# Patient Record
Sex: Male | Born: 1975 | Race: Black or African American | Hispanic: No | Marital: Married
Health system: Northeastern US, Academic
[De-identification: ages and names within clinical notes are randomized; demographics above are authoritative.]

---

## 2009-01-05 IMAGING — CR Chest
2 series · 2 of 2 positions shown · non-contrast
Comparison: none

[PA]
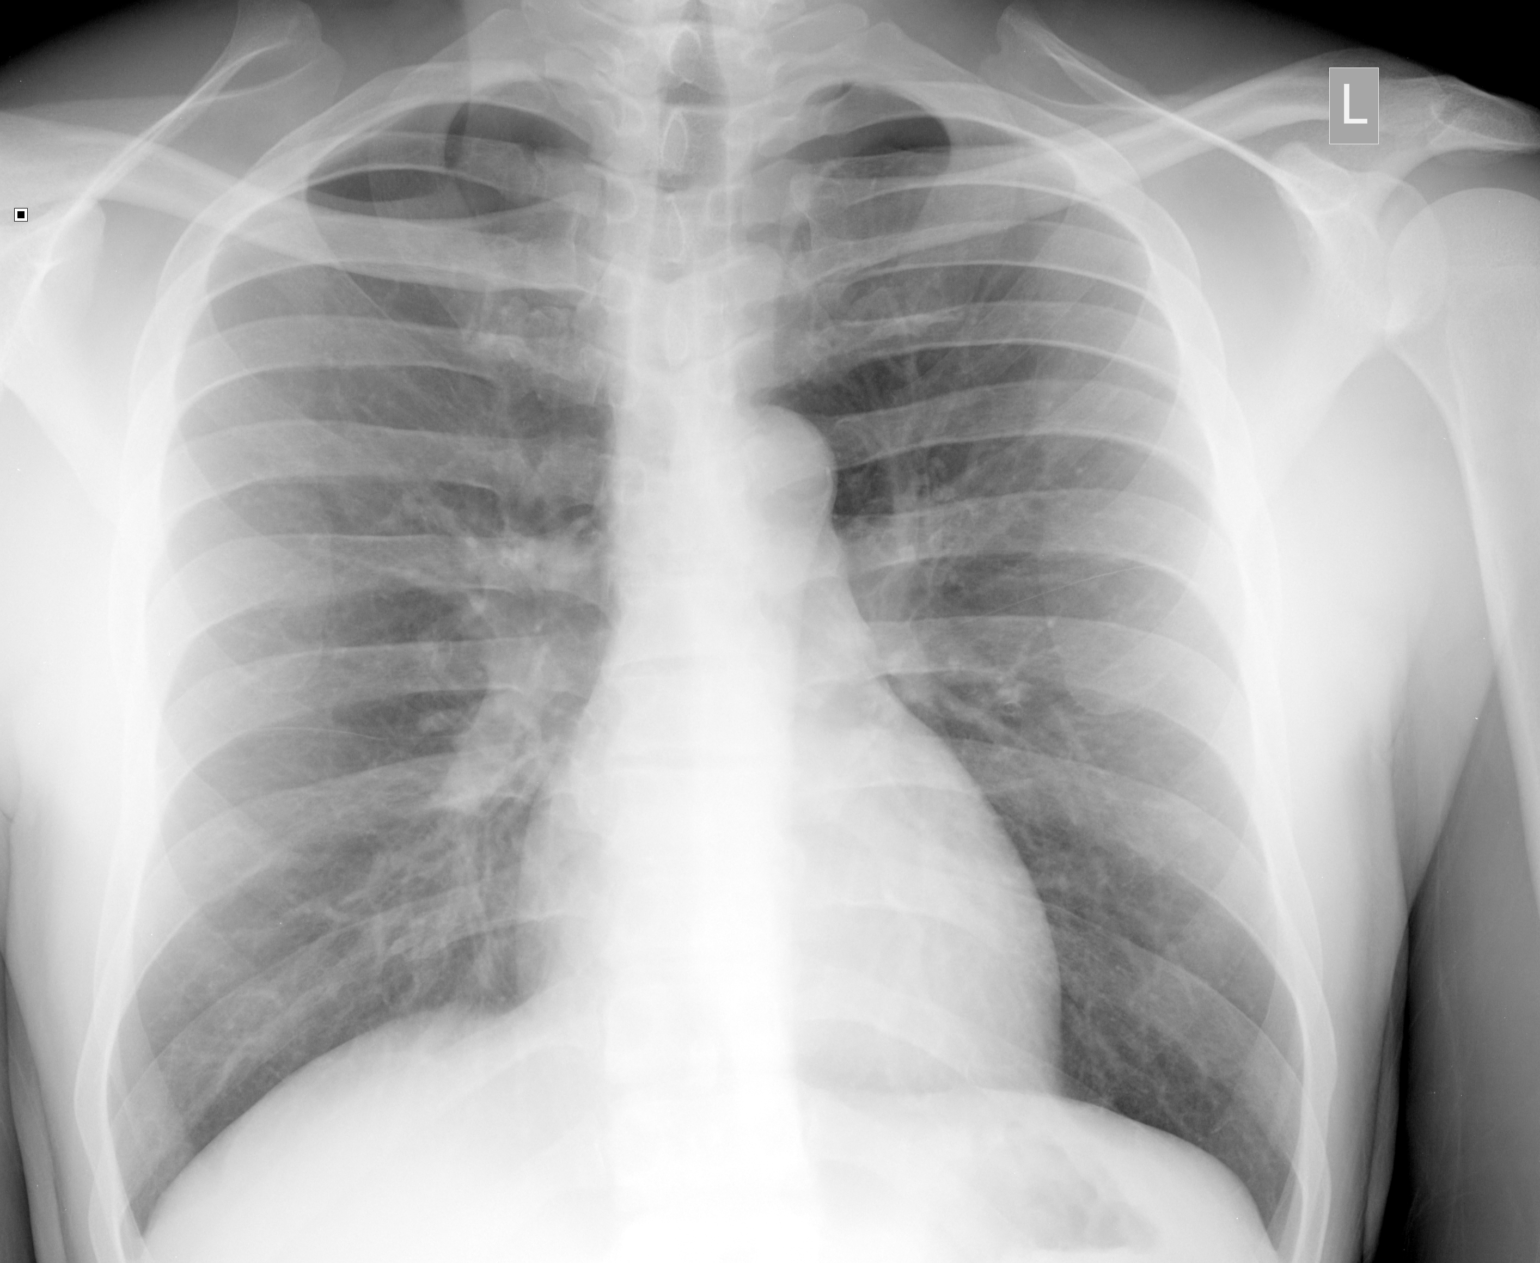

[left lateral]
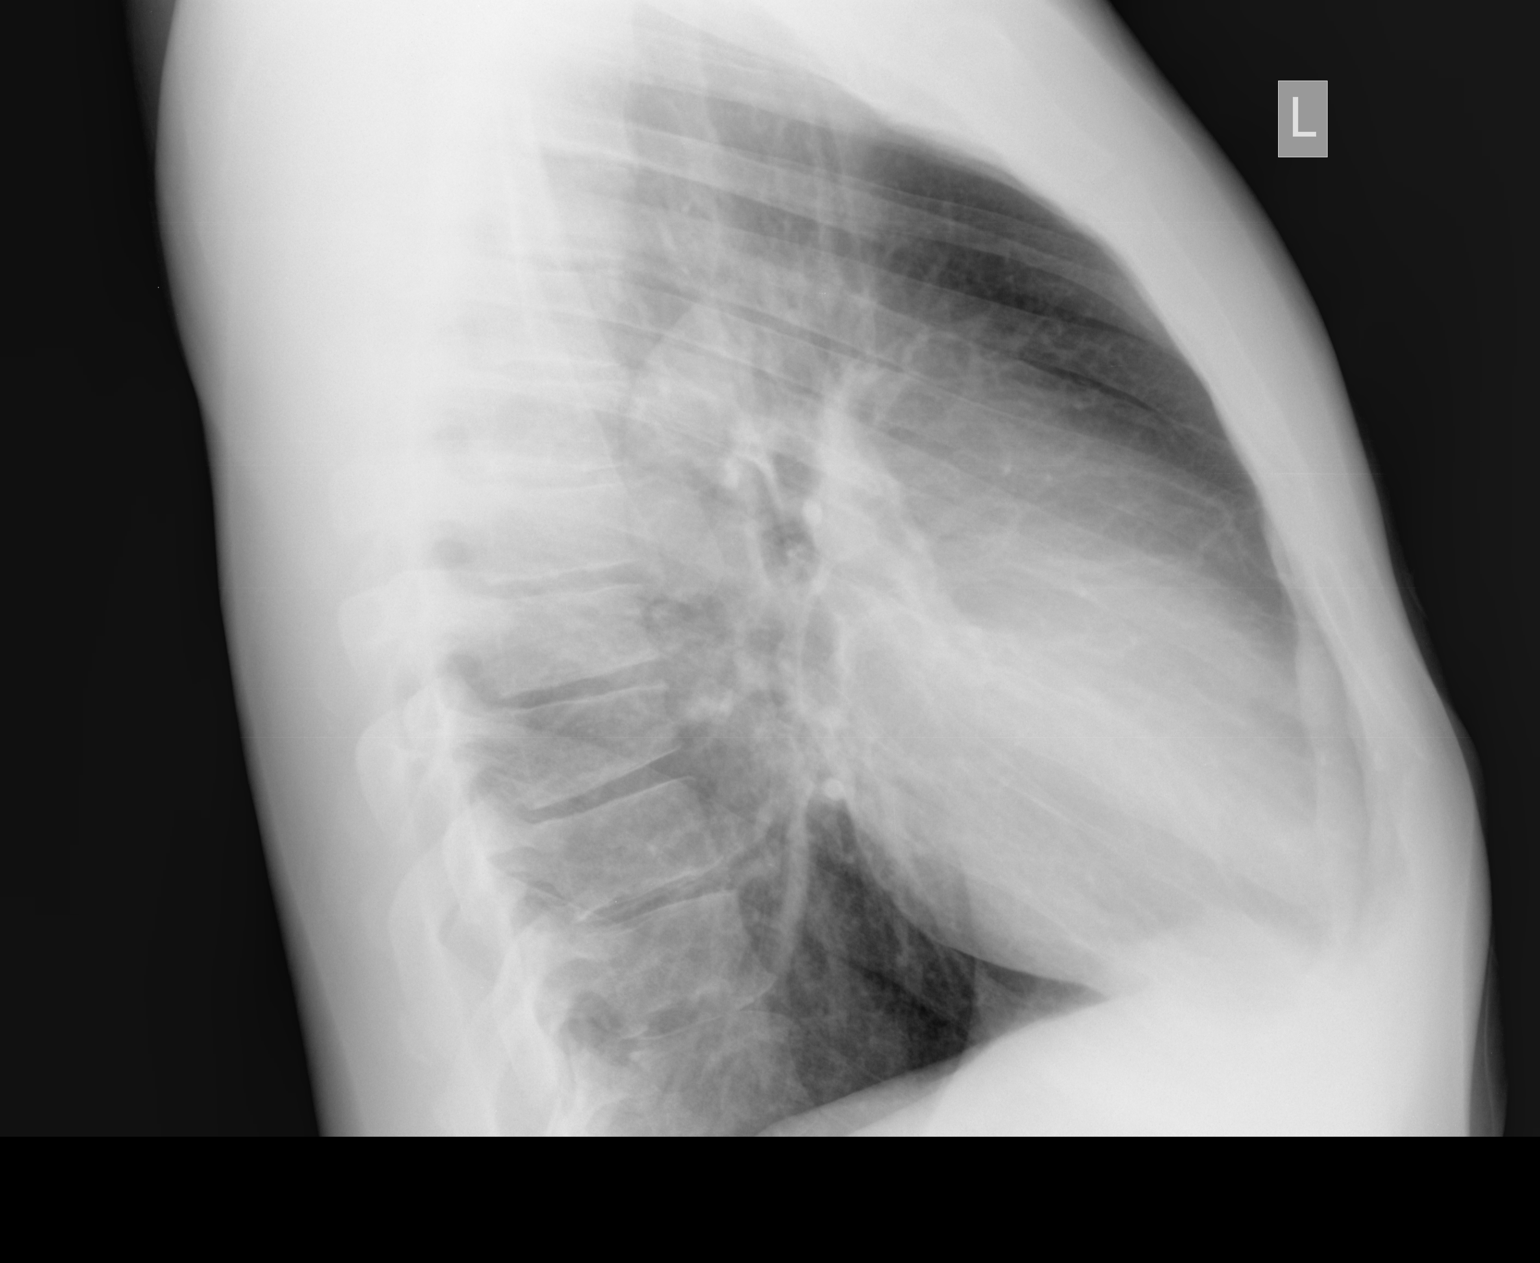

[2 of 2 positions shown; findings below may reference images not displayed]

CHEST-

PA and lateral views demonstrate the heart, hilar structures,

mediastinum and costophrenic angles to be unremarkable.  The lungs are

free of infiltrate and there is no evidence of pulmonary vascular

congestion.

IMPRESSION-  Negative chest.

## 2020-01-09 ENCOUNTER — Inpatient Hospital Stay: Admit: 2020-01-09 | Discharge: 2020-01-09 | Payer: MEDICAID | Primary: Family

## 2020-01-09 DIAGNOSIS — M545 Low back pain: Secondary | ICD-10-CM

## 2020-01-14 ENCOUNTER — Inpatient Hospital Stay: Admit: 2020-01-14 | Discharge: 2020-01-14 | Payer: MEDICAID | Primary: Family

## 2020-01-14 DIAGNOSIS — M5410 Radiculopathy, site unspecified: Secondary | ICD-10-CM

## 2020-01-14 DIAGNOSIS — M545 Chronic bilateral low back pain, unspecified whether sciatica present: Secondary | ICD-10-CM
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
## 2020-01-16 ENCOUNTER — Ambulatory Visit
Admit: 2020-01-16 | Payer: PRIVATE HEALTH INSURANCE | Attending: Rehabilitative and Restorative Service Providers" | Primary: Family

## 2020-01-17 ENCOUNTER — Inpatient Hospital Stay: Admit: 2020-01-17 | Discharge: 2020-01-17 | Payer: MEDICAID | Primary: Family

## 2020-01-17 DIAGNOSIS — M5410 Radiculopathy, site unspecified: Secondary | ICD-10-CM
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
## 2020-01-20 ENCOUNTER — Ambulatory Visit
Admit: 2020-01-20 | Payer: PRIVATE HEALTH INSURANCE | Attending: Rehabilitative and Restorative Service Providers" | Primary: Family

## 2020-01-21 ENCOUNTER — Inpatient Hospital Stay: Admit: 2020-01-21 | Discharge: 2020-01-21 | Payer: MEDICAID | Primary: Family

## 2020-01-21 DIAGNOSIS — M5410 Radiculopathy, site unspecified: Secondary | ICD-10-CM

## 2020-01-21 NOTE — Other
REHABILITATION SERVICES AT [facility] Services At [facility] [place] [address]UEAVW Number: [phone] Number: [id number] Therapy No Show Note3/16/2021Patient Name:  Dorance CultonMedical Record Number:  [id number] Date of Birth:  Apr 30, 1977Therapist:  Bonnita Levan, PTLamont Banner was a no show for an appointment today; states something came up  but did not have the contact information to call us. He states that he will be at his PT appt this Friday.Misty Stanley  I.  Albertha Beattie PT,DPT, Cert.MDTYNHH Outpatient Rehab SupervisorMil[place], [place], [place], [place]  [phone]-Th-F [phone]

## 2020-01-22 ENCOUNTER — Ambulatory Visit: Admit: 2020-01-22 | Payer: MEDICAID | Attending: Colon & Rectal Surgery | Primary: Family

## 2020-01-22 ENCOUNTER — Encounter: Admit: 2020-01-22 | Payer: PRIVATE HEALTH INSURANCE | Attending: Colon & Rectal Surgery | Primary: Family

## 2020-01-22 DIAGNOSIS — J45909 Unspecified asthma, uncomplicated: Secondary | ICD-10-CM

## 2020-01-22 DIAGNOSIS — I10 Essential (primary) hypertension: Secondary | ICD-10-CM

## 2020-01-22 DIAGNOSIS — K648 Other hemorrhoids: Secondary | ICD-10-CM

## 2020-01-22 DIAGNOSIS — K649 Unspecified hemorrhoids: Secondary | ICD-10-CM

## 2020-01-22 NOTE — Progress Notes
Chief Complaint: Bright red blood per rectumHPI: Jeff Conner is a pleasant 44 y.o. male with a past medical history significant for hypertension. He is being seen today for a referral for possible hemorrhoids.  He has had rectal bleeding, has not had leaking mucous, has had prolapsing tissue consistent with internal hemorrhoids. He has not had pain, has not swelling consistent with external hemorrhoids, has not had thrombosis.He has 2  bowel movement(s) per day. Sits on the toilet for 15 minutes each time. He drinks 64 ozs of fluids a day. He does not use a fiber supplement.He denies fecal incontinence with liquid stool, fecal incontinence with solid stool and flatal incontinence. Marland Kitchen His bowel habits have have not significantly changed.  He has had issues with bright red blood for many years.Past Medical History:He  has a past medical history of Asthma and Hypertension.Past Surgical History:He  has a past surgical history that includes Appendectomy. Medications:acetaminophen, albuterol sulfate, amLODIPine, atorvastatin, cyclobenzaprine, ibuprofen, phenylephrine-shark liver oil-mineral oil-petrolatum, and triamcinolone acetonide Allergies:Shellfish derivedReview of Systems:A 10 point review of systems was completed via patient intake form which was reviewed, and is notable for items listed in the HPI.Family History:He does not have a family history of colorectal cancers.He does not have a family history of inflammatory bowel disease.His family history includes Diabetes in his mother; Heart disease in his father. Social History:He  reports that he has been smoking cigarettes. He started smoking about 25 years ago. He has a 12.50 pack-year smoking history. He has never used smokeless tobacco. He  reports current alcohol use. He  reports previous drug use. Drug: Marijuana.Physical Exam:BP (!) 164/106  - Pulse 87  - Temp 97 ?F (36.1 ?C) (Temporal)  - Ht 6' 2.5 (1.892 m)  - Wt (!) 159.2 kg  - SpO2 98%  - BMI 44.46 kg/m?  -General: Well appearing, alert, no distress.-Anorectal:  With patient in left lateral decubitus position perianal skin showed 3 column external skin tags without associated thrombosis.  Digital exam with adequate tone, no masses.-Anoscopy:  After obtaining verbal consent the lighted clear plastic anoscope was completely inserted and gently withdrawn examining all 4 quadrants of the mucosa.  This revealed moderate 3 column internal hemorrhoids most prominent right anterior and left lateral.Endoscopy:No prior colonoscopy.Assessment and Plan:Symptomatic internal hemorrhoids. Will manage medically at this time, if refractory will evaluate at follow up for additional workup and or treatment.-I have asked him to take a high fiber diet (30g/day), and to supplement with two scoops of Metamucil or another fiber supplement twice a day.  I have encouraged him to drink at least 64oz of fluids a day, and to avoid straining or sitting on the toilet for extended periods of time. He will see me in 4 to 6 weeks to evaluate treatment and progress. 	Fayne Mediate, M.D.Colon and Rectal SurgeryAssistant ProfessorYale School of MedicineT: [phone]: [phone]

## 2020-01-22 NOTE — Progress Notes
Patient provided with fiber intake education sheet.

## 2020-01-24 ENCOUNTER — Inpatient Hospital Stay: Admit: 2020-01-24 | Discharge: 2020-01-24 | Payer: MEDICAID | Primary: Family

## 2020-01-24 DIAGNOSIS — G8929 Other chronic pain: Secondary | ICD-10-CM

## 2020-01-24 DIAGNOSIS — M545 Chronic bilateral low back pain, unspecified whether sciatica present: Secondary | ICD-10-CM

## 2020-01-24 DIAGNOSIS — M5410 Radiculopathy, site unspecified: Secondary | ICD-10-CM
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
## 2020-02-05 DIAGNOSIS — G8929 Other chronic pain: Secondary | ICD-10-CM

## 2020-02-05 DIAGNOSIS — M545 Low back pain: Secondary | ICD-10-CM

## 2020-02-05 DIAGNOSIS — M5410 Radiculopathy, site unspecified: Secondary | ICD-10-CM

## 2020-02-29 NOTE — Other
REHABILITATION SERVICES AT [facility] Services At [facility] [place] [address]UEAVW Number: [phone] Number: [id number] Therapy Orthopedic Discharge Note4/23/2021Patient Name:  Jeff CultonMedical Record Number:  [id number] Date of Birth:  September 06, 1977Therapist:  Bonnita Levan, PTReferring Provider:  Lavera Guise, MDICD-10 Diagnosis(es):Problem List         ICD-10-CM   PT Back pain , R Sided      Chronic low back pain M54.5, G89.29  Radicular low back pain M54.10  Pt atttended 4 PT sessions and made great progress with his back and hip pain. Pt has not returned to PT > 30 days, therefore recommend dc from this episode.Misty Stanley  I.  Alija Riano PT,DPT, Cert.MDTYNHH Outpatient Rehab SupervisorMil[place], [place], [place], [place]  [phone]-Th-F [phone]

## 2020-03-04 ENCOUNTER — Ambulatory Visit: Admit: 2020-03-04 | Payer: MEDICAID | Attending: Colon & Rectal Surgery | Primary: Family

## 2022-01-07 ENCOUNTER — Inpatient Hospital Stay: Admit: 2022-01-07 | Discharge: 2022-01-07 | Payer: PRIVATE HEALTH INSURANCE | Primary: Family

## 2022-02-04 ENCOUNTER — Inpatient Hospital Stay: Admit: 2022-02-04 | Discharge: 2022-02-04 | Payer: PRIVATE HEALTH INSURANCE | Primary: Family

## 2022-02-18 ENCOUNTER — Inpatient Hospital Stay: Admit: 2022-02-18 | Discharge: 2022-02-18 | Payer: PRIVATE HEALTH INSURANCE | Primary: Family

## 2022-03-04 ENCOUNTER — Inpatient Hospital Stay: Admit: 2022-03-04 | Discharge: 2022-03-04 | Payer: PRIVATE HEALTH INSURANCE | Primary: Family

## 2022-03-17 ENCOUNTER — Inpatient Hospital Stay: Admit: 2022-03-17 | Discharge: 2022-03-17 | Payer: PRIVATE HEALTH INSURANCE | Primary: Family

## 2022-05-20 ENCOUNTER — Inpatient Hospital Stay: Admit: 2022-05-20 | Discharge: 2022-05-20 | Payer: PRIVATE HEALTH INSURANCE | Primary: Family

## 2022-06-02 ENCOUNTER — Inpatient Hospital Stay: Admit: 2022-06-02 | Discharge: 2022-06-02 | Payer: PRIVATE HEALTH INSURANCE | Primary: Family

## 2022-06-20 ENCOUNTER — Inpatient Hospital Stay: Admit: 2022-06-20 | Payer: PRIVATE HEALTH INSURANCE | Primary: Family

## 2022-06-20 ENCOUNTER — Encounter: Admit: 2022-06-20 | Payer: PRIVATE HEALTH INSURANCE | Primary: Family

## 2022-06-20 ENCOUNTER — Telehealth: Admit: 2022-06-20 | Payer: PRIVATE HEALTH INSURANCE | Primary: Family

## 2022-06-20 NOTE — Telephone Encounter
Called patient colon appt has been scheduled w/Dr. Verdie Mosher in [facility] on Oct 06, 2022. Prep was reviewed and understood by pt and sent to pt via Mychart. Pt understands hospital will call w/arrival time.  It was explained to pt he will get a confirmation call/text 14 days/10 days prior; if appt has not been confirmed it could be cancelled - pt understood.  Pt understands he will need someone to accompany him home.  Pharmacy was verified-  [id number], [place] - [address]

## 2022-06-24 ENCOUNTER — Inpatient Hospital Stay: Admit: 2022-06-24 | Discharge: 2022-06-24 | Payer: PRIVATE HEALTH INSURANCE | Primary: Family

## 2022-07-18 ENCOUNTER — Inpatient Hospital Stay: Admit: 2022-07-18 | Discharge: 2022-07-18 | Payer: PRIVATE HEALTH INSURANCE | Primary: Family

## 2022-07-22 ENCOUNTER — Inpatient Hospital Stay: Admit: 2022-07-22 | Discharge: 2022-07-22 | Payer: PRIVATE HEALTH INSURANCE | Primary: Family

## 2022-07-25 ENCOUNTER — Inpatient Hospital Stay: Admit: 2022-07-25 | Discharge: 2022-07-25 | Payer: PRIVATE HEALTH INSURANCE | Primary: Family

## 2022-09-18 ENCOUNTER — Encounter: Admit: 2022-09-18 | Payer: PRIVATE HEALTH INSURANCE | Attending: Gastroenterology | Primary: Family

## 2022-09-18 MED ORDER — PEG 3350-ELECTROLYTES 236 GRAM-22.74 GRAM-6.74 GRAM-5.86 GRAM SOLUTION
236-22.74-6.74-5.86 -5.86 gram | Freq: Once | ORAL | 1 refills | Status: AC
Start: 2022-09-18 — End: ?

## 2022-09-28 ENCOUNTER — Telehealth: Admit: 2022-09-28 | Payer: PRIVATE HEALTH INSURANCE | Primary: Family

## 2022-09-28 NOTE — Telephone Encounter
LVM for patient  To confirm 11/30 colon appt patient also notified if he does not confirm his appt is subjected to cancellation.

## 2022-10-03 ENCOUNTER — Encounter: Admit: 2022-10-03 | Payer: PRIVATE HEALTH INSURANCE | Attending: Gastroenterology | Primary: Family

## 2022-10-03 DIAGNOSIS — F331 Major depressive disorder, recurrent, moderate: Secondary | ICD-10-CM

## 2022-10-03 DIAGNOSIS — F1290 Cannabis use, unspecified, uncomplicated: Secondary | ICD-10-CM

## 2022-10-03 DIAGNOSIS — F4312 Post-traumatic stress disorder, chronic: Secondary | ICD-10-CM

## 2022-10-03 DIAGNOSIS — F1621 Hallucinogen dependence, in remission: Secondary | ICD-10-CM

## 2022-10-03 DIAGNOSIS — I10 Essential (primary) hypertension: Secondary | ICD-10-CM

## 2022-10-03 DIAGNOSIS — K649 Unspecified hemorrhoids: Secondary | ICD-10-CM

## 2022-10-03 DIAGNOSIS — J45909 Unspecified asthma, uncomplicated: Secondary | ICD-10-CM

## 2022-10-03 NOTE — Other
Patient is scheduled for Colonoscopy on 10/06/2022 at [facility]. Pre-procedure call performed. Discharge transportation policy & NPO instructions given per pre-procedure guidelines. Reviewed Golytely bowel preparation My Chart 06/20/2022. Patient instructed to take Albuterol prn, Flonase prn, amlodipine with sip of water on the morning of scheduled procedure. Teach back method used. Patient verbalizes understanding.

## 2022-10-05 ENCOUNTER — Encounter: Admit: 2022-10-05 | Payer: PRIVATE HEALTH INSURANCE | Attending: Anesthesiology | Primary: Family

## 2022-10-06 ENCOUNTER — Encounter: Admit: 2022-10-06 | Payer: PRIVATE HEALTH INSURANCE | Attending: Anesthesiology | Primary: Family

## 2022-10-06 ENCOUNTER — Encounter: Admit: 2022-10-06 | Payer: MEDICAID | Primary: Family

## 2023-12-02 ENCOUNTER — Emergency Department: Admit: 2023-12-02 | Payer: MEDICAID | Primary: Family

## 2023-12-02 ENCOUNTER — Inpatient Hospital Stay
Admit: 2023-12-02 | Discharge: 2023-12-02 | Payer: MEDICAID | Attending: Student in an Organized Health Care Education/Training Program

## 2023-12-02 DIAGNOSIS — M549 Dorsalgia, unspecified: Principal | ICD-10-CM

## 2023-12-02 DIAGNOSIS — Z28310 Unvaccinated for covid-19: Secondary | ICD-10-CM

## 2023-12-02 DIAGNOSIS — Z91013 Allergy to seafood: Secondary | ICD-10-CM

## 2023-12-02 DIAGNOSIS — M546 Pain in thoracic spine: Secondary | ICD-10-CM

## 2023-12-02 DIAGNOSIS — R1012 Left upper quadrant pain: Secondary | ICD-10-CM

## 2023-12-02 LAB — CBC WITH AUTO DIFFERENTIAL
BKR WAM ABSOLUTE IMMATURE GRANULOCYTES.: 0.01 x 1000/ÂµL (ref 0.00–0.30)
BKR WAM ABSOLUTE LYMPHOCYTE COUNT.: 1.93 x 1000/ÂµL (ref 0.60–3.70)
BKR WAM ABSOLUTE NRBC (2 DEC): 0 x 1000/ÂµL (ref 0.00–1.00)
BKR WAM ANC (ABSOLUTE NEUTROPHIL COUNT): 3.19 x 1000/ÂµL (ref 2.00–7.60)
BKR WAM BASOPHIL ABSOLUTE COUNT.: 0.05 x 1000/ÂµL (ref 0.00–1.00)
BKR WAM BASOPHILS: 0.8 % (ref 0.0–1.4)
BKR WAM EOSINOPHIL ABSOLUTE COUNT.: 0.19 x 1000/ÂµL (ref 0.00–1.00)
BKR WAM EOSINOPHILS: 3.2 % (ref 0.0–5.0)
BKR WAM HEMATOCRIT (2 DEC): 40.1 % (ref 38.50–50.00)
BKR WAM HEMOGLOBIN: 13.2 g/dL (ref 13.2–17.1)
BKR WAM IMMATURE GRANULOCYTES: 0.2 % (ref 0.0–1.0)
BKR WAM LYMPHOCYTES: 32.2 % (ref 17.0–50.0)
BKR WAM MCH (PG): 29.9 pg (ref 27.0–33.0)
BKR WAM MCHC: 32.9 g/dL (ref 31.0–36.0)
BKR WAM MCV: 90.7 fL (ref 80.0–100.0)
BKR WAM MONOCYTE ABSOLUTE COUNT.: 0.62 x 1000/ÂµL (ref 0.00–1.00)
BKR WAM MONOCYTES: 10.4 % (ref 4.0–12.0)
BKR WAM MPV: 11.2 fL (ref 8.0–12.0)
BKR WAM NEUTROPHILS: 53.2 % (ref 39.0–72.0)
BKR WAM NUCLEATED RED BLOOD CELLS: 0 % (ref 0.0–1.0)
BKR WAM PLATELETS: 186 x1000/ÂµL (ref 150–420)
BKR WAM RDW-CV: 13.4 % (ref 11.0–15.0)
BKR WAM RED BLOOD CELL COUNT.: 4.42 M/ÂµL (ref 4.00–6.00)
BKR WAM WHITE BLOOD CELL COUNT: 6 x1000/ÂµL (ref 4.0–11.0)

## 2023-12-02 LAB — TROPONIN T HIGH SENSITIVITY, 0 HOUR BASELINE WITH REFLEX (BH GH LMW YH): BKR TROPONIN T HS 0 HOUR BASELINE: 10 ng/L

## 2023-12-02 LAB — BASIC METABOLIC PANEL
BKR ANION GAP: 10 (ref 7–17)
BKR BLOOD UREA NITROGEN: 16 mg/dL (ref 6–20)
BKR BUN / CREAT RATIO: 17.8 (ref 8.0–23.0)
BKR CALCIUM: 9 mg/dL (ref 8.8–10.2)
BKR CHLORIDE: 107 mmol/L (ref 98–107)
BKR CO2: 24 mmol/L (ref 20–30)
BKR CREATININE DELTA: -0.21
BKR CREATININE: 0.9 mg/dL (ref 0.40–1.30)
BKR EGFR, CREATININE (CKD-EPI 2021): >60 mL/min/{1.73_m2} (ref >=60)
BKR GLUCOSE: 138 mg/dL — ABNORMAL HIGH (ref 70–100)
BKR POTASSIUM: 3.5 mmol/L (ref 3.3–5.3)
BKR SODIUM: 141 mmol/L (ref 136–144)

## 2023-12-02 LAB — URINALYSIS WITH CULTURE REFLEX      (BH LMW YH)
BKR BILIRUBIN, UA: NEGATIVE
BKR BLOOD, UA: NEGATIVE
BKR GLUCOSE, UA: NEGATIVE
BKR KETONES, UA: NEGATIVE
BKR LEUKOCYTE ESTERASE, UA: NEGATIVE
BKR NITRITE, UA: NEGATIVE
BKR PH, UA: 6.5 (ref 5.5–7.5)
BKR PROTEIN, UA: NEGATIVE
BKR SPECIFIC GRAVITY, UA: 1.018 (ref 1.005–1.030)
BKR UROBILINOGEN, UA (MG/DL): <2 mg/dL (ref <=2.0)

## 2023-12-02 LAB — HEPATIC FUNCTION PANEL
BKR A/G RATIO: 1.4 (ref 1.0–2.2)
BKR ALANINE AMINOTRANSFERASE (ALT): 24 U/L (ref 9–59)
BKR ALBUMIN: 4.1 g/dL (ref 3.6–5.1)
BKR ALKALINE PHOSPHATASE: 45 U/L (ref 9–122)
BKR ASPARTATE AMINOTRANSFERASE (AST): 30 U/L (ref 10–35)
BKR AST/ALT RATIO: 1.3
BKR BILIRUBIN DIRECT: 0.1 mg/dL (ref <=0.2)
BKR BILIRUBIN TOTAL: 0.3 mg/dL (ref <=1.2)
BKR GLOBULIN: 2.9 g/dL (ref 2.0–3.9)
BKR PROTEIN TOTAL: 7 g/dL (ref 5.9–8.3)

## 2023-12-02 LAB — MAGNESIUM: BKR MAGNESIUM: 2.1 mg/dL (ref 1.7–2.4)

## 2023-12-02 LAB — LIPASE: BKR LIPASE: 28 U/L (ref 11–55)

## 2023-12-02 LAB — UA REFLEX CULTURE

## 2023-12-02 LAB — TROPONIN T HIGH SENSITIVITY, 1 HOUR WITH REFLEX (BH GH LMW YH)
BKR TROPONIN T HS 1 HOUR DELTA FROM 0 HOUR: 2 ng/L
BKR TROPONIN T HS 1 HOUR: 12 ng/L — ABNORMAL HIGH

## 2023-12-02 MED ORDER — METHOCARBAMOL 750 MG TABLET
750 | ORAL_TABLET | Freq: Four times a day (QID) | ORAL | 1 refills | Status: AC
Start: 2023-12-02 — End: ?

## 2023-12-02 MED ORDER — LIDOCAINE 5 % ADHESIVE PATCH
5 | MEDICATED_PATCH | TRANSDERMAL | 1 refills | Status: AC
Start: 2023-12-02 — End: ?

## 2023-12-02 MED ORDER — SODIUM CHLORIDE 0.9 % LARGE VOLUME SYRINGE FOR AUTOINJECTOR
Freq: Once | INTRAVENOUS | Status: CP | PRN
Start: 2023-12-02 — End: ?
  Administered 2023-12-02: 08:00:00 via INTRAVENOUS

## 2023-12-02 MED ORDER — KETOROLAC 15 MG/ML INJECTION SOLUTION
15 | Freq: Once | INTRAVENOUS | Status: CP
Start: 2023-12-02 — End: ?
  Administered 2023-12-02: 06:00:00 15 mL via INTRAVENOUS

## 2023-12-02 MED ORDER — ACETAMINOPHEN 325 MG TABLET
325 | Freq: Once | ORAL | Status: CP
Start: 2023-12-02 — End: ?
  Administered 2023-12-02: 06:00:00 325 mg via ORAL

## 2023-12-02 MED ORDER — LIDOCAINE 4 % TOPICAL PATCH
4 | TRANSDERMAL | Status: DC
Start: 2023-12-02 — End: 2023-12-02

## 2023-12-02 MED ORDER — IOHEXOL 350 MG IODINE/ML INTRAVENOUS SOLUTION
350 | Freq: Once | INTRAVENOUS | Status: CP | PRN
Start: 2023-12-02 — End: ?
  Administered 2023-12-02: 08:00:00 350 mL via INTRAVENOUS

## 2023-12-02 NOTE — ED Notes
 5:28 AM Pt to ED for eval sharp back pain x5 days today radiating to L flank, L neck, and L shoulder. Endorses original pain began at work 5 days ago and denies any trauma or injury prior to onset. Pain worse with movement, pt presents in visible discomfort and tearful at time of assessment. A+Ox4 responding appropriately in clear complete sentences. 7:09 AM Handoff report given to oncoming nurse and care deferred.

## 2023-12-02 NOTE — Discharge Instructions
 You were seen in the emergency department for your back pain, we evaluated you and felt comfortable discharging you home from the emergency department.  Return to the emergency department with any new or worsening symptoms you would like to be evaluated for.

## 2023-12-06 ENCOUNTER — Emergency Department: Admit: 2023-12-06 | Payer: PRIVATE HEALTH INSURANCE | Primary: Family

## 2023-12-06 ENCOUNTER — Inpatient Hospital Stay: Admit: 2023-12-06 | Discharge: 2023-12-06 | Payer: PRIVATE HEALTH INSURANCE | Attending: Emergency Medicine

## 2023-12-06 DIAGNOSIS — Z28310 Unvaccinated for covid-19: Secondary | ICD-10-CM

## 2023-12-06 DIAGNOSIS — M25522 Pain in left elbow: Secondary | ICD-10-CM

## 2023-12-06 DIAGNOSIS — M25571 Pain in right ankle and joints of right foot: Secondary | ICD-10-CM

## 2023-12-06 DIAGNOSIS — M25512 Pain in left shoulder: Secondary | ICD-10-CM

## 2023-12-06 DIAGNOSIS — M545 Low back pain, unspecified: Secondary | ICD-10-CM

## 2023-12-06 DIAGNOSIS — Z91013 Allergy to seafood: Secondary | ICD-10-CM

## 2023-12-06 MED ORDER — ACETAMINOPHEN 325 MG TABLET
325 | Freq: Once | ORAL | Status: CP
Start: 2023-12-06 — End: ?
  Administered 2023-12-06: 10:00:00 325 mg via ORAL

## 2023-12-06 MED ORDER — LIDOCAINE 4 % TOPICAL PATCH
4 | TRANSDERMAL | Status: DC
Start: 2023-12-06 — End: 2023-12-06

## 2023-12-06 MED ORDER — CYCLOBENZAPRINE 10 MG TABLET
10 | Freq: Once | ORAL | Status: CP
Start: 2023-12-06 — End: ?
  Administered 2023-12-06: 10:00:00 10 mg via ORAL
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
## 2023-12-06 NOTE — ED Notes
 2:10 PM - Pt discharged by provider. VSS, in NAD, AAOx(4). Pt verbalized understanding of AVS and return to ED precaution teaching and denies further questions at this time. All belongings w/ pt. Pt ambulated out of ED with steady gait. Denies need for transport.
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
## 2023-12-14 NOTE — ED Provider Notes
 Chief Complaint Patient presents with  Medical Problem   Reports to ED with left flank pain. Reproducible with movement 8/10 pain that is sharp in nature. Also reports that the pain is shooting to left arm and shoulder. Denies pain medication en route.  Emergency Medicine Attending MDMSynopsis: 48 y.o. male with significant medical history of asthma, HTN, presenting for back pain. Patient states started 5 days ago. Feels like sharp pain radiating from L flank across his back up to his L neck and L shoulder blade.  He states started while at work 5 days ago.  He denies any inciting trauma or event.  He denies any associated shortness of breath, difficulty breathing, syncope, diaphoresis, nausea vomiting or diarrhea. He denies any overt chest pain but states at times he feels the pain wrap anteriorly to his chest. Worsened by movement. Denies urinary symptoms.  Reports significant family history of coronary artery disease including mom within MI at 6 and dad with an MI in his early 5s.On exam, patient appears uncomfortable but HD normal, bilateral 2+ equal radial pulses, 5/5 strength, normal sensation and pulses in bilateral LE, pain to palpation of the L lateral neck, L upper back/paraspinal region over rhomboids, and L lateral mid back pain over area of Lat insertion. Pain exacerbation by palpation on exam and ROM of the LUE. Lungs clear, heart sounds normal. Abdomen is soft with minimal tenderness in LUQ. Concern at this time for acute ACS, anginal equivalent, kidney stone. Also considered pericarditis, myocarditis, dissection however history and exam not fully consistent with these diagnoses and bedside echo with normal root, no effusion, EKG without elevations or depressions. Also considered msk back pain vs. Muscle spasm.  Lower suspicion for pancreatitis, biliary pathology, gastritis, GERD. Plan for labs, EKG, cxr, troponin, and bedside echo, [place] flank. Pain control with toradol, tylenol and lidoderm.Vitals:  12/02/23 0849 BP: 139/81 Pulse: 63 Resp: 18 Temp: 98.1 ?F (36.7 ?C) ------------------------------------Mosetta Anis, MD	Attending Physician1/25/2025 6:10 AMMDM  Physical ExamED Triage Vitals [12/02/23 0523]BP: (!) 162/110Pulse: 70Pulse from  O2 sat: n/aResp: 18Temp: 98 ?F (36.7 ?C)Temp src: TemporalSpO2: 99 % BP 139/81  - Pulse 63  - Temp 98.1 ?F (36.7 ?C)  - Resp 18  - SpO2 100% Physical Exam ProceduresAttestation/Critical CareComments as of 12/14/23 2248 Sat Dec 02, 2023 0631 Patient re-evaluated, shortly after Tylenol Toradol and Lidoderm patch, patient with complete resolution of back pain and chest pain.  Patient is still reports strong pressure sensation in the left flank. Plan for [place] aorta for evaluation of aorta vs. StoneLabs with no leukocytosis, initial troponin 10.  Normal electrolytes and normal renal function.  LFTs are normal. [AP] [phone] Signout to me, coming in with back and chest pain, pending CTA. DC if everything negative.  [ZB] [id number] Signed out to Dr. Beatrice Lecher pending repeat troponin, urine and [place] aorta. [AP]  Comments User Index[AP] Mosetta Anis, MD[ZB] Genelle Gather, MD   Clinical Impressions as of 12/14/23 2248 Acute left-sided thoracic back pain Left flank pain  ED DispositionDischarge  Mosetta Anis, MD01/25/25 9604 Mosetta Anis, MD02/06/25 2248

## 2023-12-21 ENCOUNTER — Inpatient Hospital Stay: Admit: 2023-12-21 | Discharge: 2023-12-21 | Payer: PRIVATE HEALTH INSURANCE | Primary: Family

## 2024-01-01 ENCOUNTER — Inpatient Hospital Stay: Admit: 2024-01-01 | Discharge: 2024-01-01 | Payer: PRIVATE HEALTH INSURANCE | Primary: Family

## 2024-01-11 NOTE — Telephone Encounter
 Call from [facility] requesting his work note reflect that he should not work until cleared by his provider/Occupational Medicine as this was reportedly a work related injury.  Provider note has a RTW note of 12/08/23 (Friday) and he was not able to secure a f/u appt until the following week.  Message sent to Dr Thornell Mule with this request.

## 2024-01-15 ENCOUNTER — Inpatient Hospital Stay: Admit: 2024-01-15 | Discharge: 2024-01-15 | Payer: PRIVATE HEALTH INSURANCE | Primary: Family

## 2024-01-19 ENCOUNTER — Inpatient Hospital Stay: Admit: 2024-01-19 | Discharge: 2024-01-19 | Payer: PRIVATE HEALTH INSURANCE | Primary: Family

## 2024-01-22 ENCOUNTER — Inpatient Hospital Stay: Admit: 2024-01-22 | Discharge: 2024-01-22 | Payer: PRIVATE HEALTH INSURANCE | Primary: Family

## 2024-01-29 ENCOUNTER — Inpatient Hospital Stay: Admit: 2024-01-29 | Discharge: 2024-01-29 | Payer: PRIVATE HEALTH INSURANCE | Primary: Family

## 2024-02-02 ENCOUNTER — Encounter: Admit: 2024-02-02 | Payer: PRIVATE HEALTH INSURANCE | Attending: Emergency Medicine | Primary: Family

## 2024-02-02 NOTE — Care Coordination-Inpatient
 Message received from Dr. Thornell Mule with amended letter that pt cannot return until cleared by outpt provider, however when attempting to send to MyChart for pt viewing, the letter is no longer viewable.  New Note created with same info and added Dr Thornell Mule as signer, as she had authorized this change.

## 2024-02-05 ENCOUNTER — Inpatient Hospital Stay: Admit: 2024-02-05 | Discharge: 2024-02-05 | Payer: PRIVATE HEALTH INSURANCE | Primary: Family

## 2024-02-12 ENCOUNTER — Inpatient Hospital Stay: Admit: 2024-02-12 | Discharge: 2024-02-12 | Payer: PRIVATE HEALTH INSURANCE | Primary: Family

## 2024-02-19 ENCOUNTER — Inpatient Hospital Stay: Admit: 2024-02-19 | Discharge: 2024-02-19 | Payer: PRIVATE HEALTH INSURANCE | Primary: Family

## 2024-02-20 ENCOUNTER — Inpatient Hospital Stay: Admit: 2024-02-20 | Discharge: 2024-02-20 | Payer: PRIVATE HEALTH INSURANCE | Primary: Family

## 2024-02-26 ENCOUNTER — Inpatient Hospital Stay: Admit: 2024-02-26 | Discharge: 2024-02-26 | Payer: PRIVATE HEALTH INSURANCE | Primary: Family

## 2024-02-27 ENCOUNTER — Inpatient Hospital Stay: Admit: 2024-02-27 | Discharge: 2024-02-27 | Payer: PRIVATE HEALTH INSURANCE | Primary: Family

## 2024-03-04 ENCOUNTER — Inpatient Hospital Stay: Admit: 2024-03-04 | Discharge: 2024-03-04 | Payer: PRIVATE HEALTH INSURANCE | Primary: Family

## 2024-03-11 ENCOUNTER — Inpatient Hospital Stay: Admit: 2024-03-11 | Discharge: 2024-03-11 | Payer: PRIVATE HEALTH INSURANCE | Primary: Family

## 2024-03-14 ENCOUNTER — Inpatient Hospital Stay: Admit: 2024-03-14 | Discharge: 2024-03-14 | Payer: PRIVATE HEALTH INSURANCE | Primary: Family

## 2024-06-27 ENCOUNTER — Encounter: Admit: 2024-06-27 | Payer: PRIVATE HEALTH INSURANCE

## 2024-06-27 ENCOUNTER — Emergency Department: Admit: 2024-06-27 | Payer: MEDICAID

## 2024-06-27 ENCOUNTER — Inpatient Hospital Stay: Admit: 2024-06-27 | Discharge: 2024-06-27 | Payer: MEDICAID | Attending: Emergency Medicine

## 2024-06-27 DIAGNOSIS — Z91013 Allergy to seafood: Secondary | ICD-10-CM

## 2024-06-27 DIAGNOSIS — R519 Headache, unspecified: Secondary | ICD-10-CM

## 2024-06-27 DIAGNOSIS — I671 Cerebral aneurysm, nonruptured: Secondary | ICD-10-CM

## 2024-06-27 DIAGNOSIS — R42 Dizziness and giddiness: Secondary | ICD-10-CM

## 2024-06-27 DIAGNOSIS — I10 Essential (primary) hypertension: Secondary | ICD-10-CM

## 2024-06-27 DIAGNOSIS — G43909 Migraine, unspecified, not intractable, without status migrainosus: Principal | ICD-10-CM

## 2024-06-27 LAB — CBC WITH AUTO DIFFERENTIAL
BKR WAM ABSOLUTE IMMATURE GRANULOCYTES.: 0.02 x 1000/ÂµL (ref 0.00–0.30)
BKR WAM ABSOLUTE LYMPHOCYTE COUNT.: 1.91 x 1000/ÂµL (ref 0.60–3.70)
BKR WAM ABSOLUTE NRBC: 0 x 1000/ÂµL (ref 0.00–1.00)
BKR WAM ANC (ABSOLUTE NEUTROPHIL COUNT): 3.85 x 1000/ÂµL (ref 2.00–7.60)
BKR WAM BASOPHIL ABSOLUTE COUNT.: 0.08 x 1000/ÂµL (ref 0.00–1.00)
BKR WAM BASOPHILS: 1.1 % (ref 0.0–1.4)
BKR WAM EOSINOPHIL ABSOLUTE COUNT.: 0.92 x 1000/ÂµL (ref 0.00–1.00)
BKR WAM EOSINOPHILS: 12.4 % — ABNORMAL HIGH (ref 0.0–5.0)
BKR WAM HEMATOCRIT: 42.2 % (ref 38.50–50.00)
BKR WAM HEMOGLOBIN: 14 g/dL (ref 13.2–17.1)
BKR WAM IMMATURE GRANULOCYTES: 0.3 % (ref 0.0–1.0)
BKR WAM LYMPHOCYTES: 25.7 % (ref 17.0–50.0)
BKR WAM MCH: 30.6 pg (ref 27.0–33.0)
BKR WAM MCHC: 33.2 g/dL (ref 31.0–36.0)
BKR WAM MCV: 92.1 fL (ref 80.0–100.0)
BKR WAM MONOCYTE ABSOLUTE COUNT.: 0.64 x 1000/ÂµL (ref 0.00–1.00)
BKR WAM MONOCYTES: 8.6 % (ref 4.0–12.0)
BKR WAM MPV: 10.4 fL (ref 8.0–12.0)
BKR WAM NEUTROPHILS: 51.9 % (ref 39.0–72.0)
BKR WAM NUCLEATED RED BLOOD CELLS: 0 % (ref 0.0–1.0)
BKR WAM PLATELETS: 216 x1000/ÂµL (ref 150–420)
BKR WAM RDW-CV: 13.9 % (ref 11.0–15.0)
BKR WAM RED BLOOD CELL COUNT.: 4.58 M/ÂµL (ref 4.00–6.00)
BKR WAM WHITE BLOOD CELL COUNT: 7.4 x1000/ÂµL (ref 4.0–11.0)

## 2024-06-27 LAB — BASIC METABOLIC PANEL
BKR ANION GAP: 9 (ref 7–17)
BKR BLOOD UREA NITROGEN: 13 mg/dL (ref 6–20)
BKR BUN / CREAT RATIO: 11.8 (ref 8.0–23.0)
BKR CALCIUM: 9.3 mg/dL (ref 8.8–10.2)
BKR CHLORIDE: 106 mmol/L (ref 98–107)
BKR CO2: 26 mmol/L (ref 20–30)
BKR CREATININE DELTA: 0.2
BKR CREATININE: 1.1 mg/dL (ref 0.40–1.30)
BKR EGFR, CREATININE (CKD-EPI 2021): >60 mL/min/1.73m2 (ref >=60)
BKR GLUCOSE: 129 mg/dL — ABNORMAL HIGH (ref 70–100)
BKR POTASSIUM: 3.5 mmol/L (ref 3.3–5.3)
BKR SODIUM: 141 mmol/L (ref 136–144)

## 2024-06-27 MED ORDER — PROCHLORPERAZINE EDISYLATE 10 MG/2 ML (5 MG/ML) INJECTION SOLUTION
10 | Freq: Once | INTRAVENOUS | Status: CP
Start: 2024-06-27 — End: ?
  Administered 2024-06-27: 05:00:00 10 mL via INTRAVENOUS

## 2024-06-27 MED ORDER — ACETAMINOPHEN 325 MG TABLET
325 | Freq: Once | ORAL | Status: CP
Start: 2024-06-27 — End: ?
  Administered 2024-06-27: 05:00:00 325 mg via ORAL

## 2024-06-27 MED ORDER — HYDROCHLOROTHIAZIDE 12.5 MG TABLET
12.5 | Freq: Once | ORAL | Status: CP
Start: 2024-06-27 — End: ?
  Administered 2024-06-27: 09:00:00 12.5 mg via ORAL

## 2024-06-27 MED ORDER — AMLODIPINE 10 MG TABLET
10 | Freq: Once | ORAL | Status: CP
Start: 2024-06-27 — End: ?
  Administered 2024-06-27: 09:00:00 10 mg via ORAL

## 2024-06-27 MED ORDER — SODIUM CHLORIDE 0.9 % LARGE VOLUME SYRINGE FOR AUTOINJECTOR
Freq: Once | INTRAVENOUS | Status: CP | PRN
Start: 2024-06-27 — End: ?
  Administered 2024-06-27: 06:00:00 via INTRAVENOUS

## 2024-06-27 MED ORDER — IOHEXOL 350 MG IODINE/ML INTRAVENOUS SOLUTION
350 | Freq: Once | INTRAVENOUS | Status: CP | PRN
Start: 2024-06-27 — End: ?
  Administered 2024-06-27: 06:00:00 350 mL via INTRAVENOUS

## 2024-06-27 MED ORDER — DIPHENHYDRAMINE 50 MG/ML INJECTION (WRAPPED E-RX)
50 | Freq: Once | INTRAVENOUS | Status: CP
Start: 2024-06-27 — End: ?
  Administered 2024-06-27: 05:00:00 50 mL via INTRAVENOUS

## 2024-06-27 MED ORDER — ROSUVASTATIN 10 MG TABLET
10 | Freq: Once | ORAL | Status: CP
Start: 2024-06-27 — End: ?
  Administered 2024-06-27: 09:00:00 10 mg via ORAL

## 2024-06-27 NOTE — Discharge Instructions
 Thank you for entrusting us  with your medical care. It was our pleasure meeting and taking care of you. 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  You should also quit smoking.  This is to prevent an aneurysm rupture.  A message has been sent to your primary care physician and you should follow up to have repeat imaging in the next several months to monitored stability.  A referral to neurosurgery has been placed for you to follow up within the next several weeks.Please follow up with your primary care doctor in the next business day.If you need a primary care provider please try one of the following options: -In [facility]: [facility] [phone]), [facility] [phone]), or [facility] [phone]).-In [place]: [phone] you develop fever not controlled by tylenol /ibuprofen, difficulty breathing, chest pain, uncontrollable vomiting, or any other new or concerning symptoms, please return to the emergency department for further evaluation.

## 2024-07-01 ENCOUNTER — Ambulatory Visit: Admit: 2024-07-01 | Payer: MEDICAID | Attending: Neurological Surgery

## 2024-07-01 ENCOUNTER — Encounter: Admit: 2024-07-01 | Payer: PRIVATE HEALTH INSURANCE | Attending: Neurological Surgery

## 2024-07-01 VITALS — BP 149/92 | HR 67 | Ht 74.5 in | Wt 318.8 lb

## 2024-07-01 DIAGNOSIS — F4312 Post-traumatic stress disorder, chronic: Secondary | ICD-10-CM

## 2024-07-01 DIAGNOSIS — F331 Major depressive disorder, recurrent, moderate: Secondary | ICD-10-CM

## 2024-07-01 DIAGNOSIS — K649 Unspecified hemorrhoids: Secondary | ICD-10-CM

## 2024-07-01 DIAGNOSIS — F1290 Cannabis use, unspecified, uncomplicated: Secondary | ICD-10-CM

## 2024-07-01 DIAGNOSIS — I671 Cerebral aneurysm, nonruptured: Principal | ICD-10-CM

## 2024-07-01 DIAGNOSIS — I10 Essential (primary) hypertension: Principal | ICD-10-CM

## 2024-07-01 DIAGNOSIS — J45909 Unspecified asthma, uncomplicated: Secondary | ICD-10-CM

## 2024-07-01 DIAGNOSIS — F1621 Hallucinogen dependence, in remission: Secondary | ICD-10-CM

## 2024-07-01 NOTE — Progress Notes
 Patient Name: Jeff Conner of Birth: [birth year]Chief Complaint: No chief complaint on file.History of Present IllnessLamont Conner is a 48 year old male who presents with a headache and dizziness following a [place] scan that revealed an incidental aneurysm.He experiences headache and dizziness, which he associates with an argument. 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 He has hypertension, managed with medication, with generally good blood pressure readings.There is no family history of brain aneurysms. He resides in [facility]. Past Medical History: He  has a past medical history of Asthma (HC CODE), Cannabis use disorder, Chronic post-traumatic stress disorder (PTSD), Hemorrhoids, Hypertension, Major depressive disorder, recurrent episode, moderate (HC Code) (HC CODE), and Phencyclidine use disorder, moderate, in sustained remission. Past Surgical History: He  has a past surgical history that includes Appendectomy (2019).Current Outpatient Medications:   albuterol sulfate, 2 puff, Inhalation, Q4H PRN  amLODIPine , 10 mg, Oral, Daily  atorvastatin, 20 mg, Oral, Nightly  Jublia, 1 Application , Topical (Top), Nightly  fluticasone propionate, 1 spray, each nostril, BID  hydroCHLOROthiazide , 25 mg, Oral, Daily  ibuprofen, 600 mg, Oral, Q6H PRN  lidocaine , 1 patch, Transdermal, Q24HAllergies: He is allergic to shellfish derived.Social History:He  reports current drug use. Drug: Marijuana.He  reports that he does not currently use alcohol.He  reports that he has been smoking cigarettes. He started smoking about 30 years ago. He has a 15.1 pack-year smoking history. He has never used smokeless tobacco.Review of SystemsI have reviewed the review of systems, as noted by the clinical support staff.BP (!) 149/92 (Site: l a, Position: Sitting, Cuff Size: Large)  - Pulse 67  - Ht 6' 2.5 (1.892 m)  - Wt (!) 144.6 kg  - BMI 40.38 kg/m?  AAOxpptsSpeech FluentPERRL, EOMIVisual Fields Full, no nystagmusShoulder Shrug 5/5 symmetricMoving All Extremities Well 5/5 ThroughoutNo DriftNo ExtinctionNo ataxiaPhysical ExamVITALS: BP- 149/92 Imaging: CTA 06/27/24: IMPRESSION:[place] HEAD: No acute intracranial pathology. Acute ischemic infarcts may not be readily apparent on [place] head examinations. If concern persists, MRI brain examination can be performed for further evaluation. CTA HEAD: No large vessel occlusion or flow-limiting stenosis. 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 CTA NECK: No significant cervical artery stenosis or evidence of dissection. Reported and signed by: Jeff Rode, MD   Assessment & PlanUnruptured right middle cerebral artery aneurysm, 5 mm5 mm unruptured aneurysm at right middle cerebral artery bifurcation. Low rupture risk; intervention risk higher than rupture risk. Immediate treatment not recommended.- Obtain MRA in one year to monitor aneurysm.- Advise blood pressure control.- Advise smoking cessation.- No lifestyle restrictions necessary.- Consider angiogram if aneurysm size increases. We reviewed the natural history of unruptured intracranial aneurysms based on best available data. We also reviewed the modifiable and non-modifiable risk factors associated with aneurysm formation, rupture and recurrence; including smoking, HTN and family history. I am recommending surveillance imaging. We will obtain an MRA in one year.Jeff Conner, Jeff Conner, et al. Unruptured intracranial aneurysms: Natural history, clinical outcome, and risks of surgical and endovascular treatment. Lancet. 2003;362:103-110. [employer] [place] [employer]. The Natural Course of Unruptured Cerebral Aneurysms in a [place] Cohort. Jeff Conner Med. 2012; 633:7525-7517P provided a concise overview of the ambient note generation solution. Jeff Conner or their legally authorized representative verbally consented to a temporary audio recording of their visit to assist with completing the visit documentation using an AI-powered solution.  This note was reviewed for accuracy by Jeff Conner who performed the clinical service. Electronically signed ab:Jeff Donnice Byers, MD

## 2024-07-04 NOTE — ED Provider Notes
 Chief Complaint Patient presents with  Headache- Recurrent Or Known Dx Migraines   Recently stopped talking HTN medication because his wife told him that it lowered his sperm count. Awoke with a headache. Proceeded to vomited with dizziness. Provided with emotional support. Alert and orientedx4.  HPI/PE:48 year old male with hypertension, asthma, PTSD presents with diffuse headache, dizziness, nausea and one episode of vomiting that began approximately 4 hours ago.  Patient states he woke up to use the restroom and the symptoms started when he was walking back to bed.  His wife gave him unknown pain medication for his headache (possibly Aleve) and he tried to wait it out without improvement.  Patient admits to stopping his blood pressure medication 1 week ago after his wife states it lowers his sperm count and they are trying to conceive.  He denies any fever, chills, chest pain, shortness of breath, cough, abdominal pain, focal weakness, numbness, or paresthesias.  He admits to worsening vision when reading closely for the past year but denies any acute change in vision.  He denies any difficulty walking or ataxia.FIF:Ipqqzmzwupjo includes migraine, vertigo, tension headache, hypertensive urgency, hypertensive emergency, dissection, CVA/TIA.  We will obtain CBC, BMP, EKG, CTA head/neck.  Tylenol , Compazine , Benadryl  given for symptomatic relief.EKG shows sinus bradycardia at 58.  Physical ExamED Triage Vitals [06/27/24 0400]BP: (!) 182/104Pulse: (!) 54Pulse from  O2 sat: n/aResp: 20Temp: 98 ?F (36.7 ?C)Temp src: TemporalSpO2: 100 % BP (!) 182/104  - Pulse (!) 54  - Temp 98 ?F (36.7 ?C) (Temporal)  - Resp 20  - SpO2 100% Physical ExamHENT:    Head: Normocephalic and atraumatic. Eyes:    Extraocular Movements: Extraocular movements intact.    Conjunctiva/sclera: Conjunctivae normal.    Pupils: Pupils are equal, round, and reactive to light. Cardiovascular:    Rate and Rhythm: Normal rate and regular rhythm.    Heart sounds: Normal heart sounds. Pulmonary:    Effort: Pulmonary effort is normal.    Breath sounds: Normal breath sounds. Abdominal:    General: Abdomen is flat.    Palpations: Abdomen is soft.    Tenderness: There is no abdominal tenderness. Musculoskeletal:       General: Normal range of motion.    Cervical back: Normal range of motion. No tenderness. Skin:   General: Skin is warm and dry. Neurological:    General: No focal deficit present.    Mental Status: He is alert and oriented to person, place, and time.    Cranial Nerves: No cranial nerve deficit.    Sensory: No sensory deficit.    Motor: No weakness.    Coordination: Coordination normal.    Gait: Gait normal. Psychiatric:       Mood and Affect: Mood normal.       Behavior: Behavior normal.  ProceduresAttestation/Critical Care Attending Supervised: ResidentI saw and examined the patient. 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  Discussed findings with patient's and importance of taking home blood pressure medications, quitting smoking, following up with the primary care doctor for repeat imaging, and given neurosurgery referral.  Patient expressed understanding.  Given home blood pressure medications while in the ED. All questions answered given FYI to PCP.  Patient reporting feeling better after migraine cocktail, ambulating without difficulty and tolerating p.o..  Repeat neurological exam intact. I  considered Admission/Observation but the patient is stable for discharge. Return precautions extensively discussed, follow-up outpatient. Patient counseled regarding importance of adhering to his prescribed blood pressure medications and smoking cessation.  Discuss with patient the need for urgent follow-up with his primary care doctor regarding his blood pressure and the need for an interval evaluation after several months to assess for any change in the size of his saccular Brown Lynn, MD, [facility] Attending Available on MHBComments as of 07/04/24 1259 Thu Jun 27, 2024 0609 48yo male, n/v, headspinning after argument. Neuro intact. Pending [place] [facility] and likely home. Has stopped home BP meds. 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  Discussed findings with patient's and importance of taking home blood pressure medications, quitting smoking, following up with the primary care doctor for repeat imaging, and given neurosurgery referral.  Patient expressed understanding.  Given home blood pressure medications while in the ED. All questions answered given FYI/rounded ED note to PCP.  Patient reporting feeling better after migraine cocktail, ambulating without difficulty and tolerating p.o..  Repeat neurological exam intact. I considered Admission/Observation but the patient is stable for discharge. Return precautions extensively discussed, follow-up outpatient.  [RT]  Comments User Index[RT] Clearence Counts, MD   Clinical Impressions as of 07/04/24 1259 Acute nonintractable headache, unspecified headache type Dizziness Hypertension, unspecified type Aneurysm of middle cerebral artery [facility] CODE)  ED DispositionDischarge  Clearence Counts, MDResident08/21/25 0738 Lynn Colin KIDD, MD08/21/25 1023 Vicenta Leita Murray, DO08/28/25 [phone]

## 2024-10-16 ENCOUNTER — Inpatient Hospital Stay: Admit: 2024-10-16 | Discharge: 2024-10-16 | Payer: PRIVATE HEALTH INSURANCE

## 2024-11-20 ENCOUNTER — Ambulatory Visit: Admit: 2024-11-20 | Payer: PRIVATE HEALTH INSURANCE | Attending: Orthopedic

## 2024-11-20 DIAGNOSIS — M545 Low back pain, unspecified back pain laterality, unspecified chronicity, unspecified whether sciatica present: Principal | ICD-10-CM

## 2024-12-05 ENCOUNTER — Inpatient Hospital Stay: Admit: 2024-12-05 | Discharge: 2024-12-06 | Payer: PRIVATE HEALTH INSURANCE
